# Patient Record
Sex: Female | Born: 2000
Health system: Southern US, Community
[De-identification: ages and names within clinical notes are randomized; demographics above are authoritative.]

## PROBLEM LIST (undated history)

## (undated) DIAGNOSIS — K589 Irritable bowel syndrome without diarrhea: Secondary | ICD-10-CM

## (undated) DIAGNOSIS — K219 Gastro-esophageal reflux disease without esophagitis: Secondary | ICD-10-CM

## (undated) DIAGNOSIS — F419 Anxiety disorder, unspecified: Secondary | ICD-10-CM

## (undated) DIAGNOSIS — F32A Depression, unspecified: Secondary | ICD-10-CM

## (undated) DIAGNOSIS — F329 Major depressive disorder, single episode, unspecified: Secondary | ICD-10-CM

---
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
## 2018-02-27 DIAGNOSIS — J301 Allergic rhinitis due to pollen: Secondary | ICD-10-CM | POA: Diagnosis not present

## 2018-02-27 DIAGNOSIS — J3089 Other allergic rhinitis: Secondary | ICD-10-CM | POA: Diagnosis not present

## 2018-02-27 DIAGNOSIS — J3081 Allergic rhinitis due to animal (cat) (dog) hair and dander: Secondary | ICD-10-CM | POA: Diagnosis not present

## 2018-04-11 DIAGNOSIS — J301 Allergic rhinitis due to pollen: Secondary | ICD-10-CM | POA: Diagnosis not present

## 2018-04-11 DIAGNOSIS — J3089 Other allergic rhinitis: Secondary | ICD-10-CM | POA: Diagnosis not present

## 2018-04-11 DIAGNOSIS — J3081 Allergic rhinitis due to animal (cat) (dog) hair and dander: Secondary | ICD-10-CM | POA: Diagnosis not present

## 2018-04-17 DIAGNOSIS — N946 Dysmenorrhea, unspecified: Secondary | ICD-10-CM | POA: Diagnosis not present

## 2018-04-17 DIAGNOSIS — L709 Acne, unspecified: Secondary | ICD-10-CM | POA: Diagnosis not present

## 2018-04-25 DIAGNOSIS — L709 Acne, unspecified: Secondary | ICD-10-CM | POA: Diagnosis not present

## 2018-04-25 DIAGNOSIS — M542 Cervicalgia: Secondary | ICD-10-CM | POA: Diagnosis not present

## 2018-04-25 DIAGNOSIS — N946 Dysmenorrhea, unspecified: Secondary | ICD-10-CM | POA: Diagnosis not present

## 2018-04-25 DIAGNOSIS — N939 Abnormal uterine and vaginal bleeding, unspecified: Secondary | ICD-10-CM | POA: Diagnosis not present

## 2018-04-26 DIAGNOSIS — G510 Bell's palsy: Secondary | ICD-10-CM | POA: Diagnosis not present

## 2018-05-09 DIAGNOSIS — K219 Gastro-esophageal reflux disease without esophagitis: Secondary | ICD-10-CM | POA: Diagnosis not present

## 2018-05-09 DIAGNOSIS — K8050 Calculus of bile duct without cholangitis or cholecystitis without obstruction: Secondary | ICD-10-CM | POA: Diagnosis not present

## 2018-05-09 DIAGNOSIS — R197 Diarrhea, unspecified: Secondary | ICD-10-CM | POA: Diagnosis not present

## 2018-05-16 DIAGNOSIS — J3089 Other allergic rhinitis: Secondary | ICD-10-CM | POA: Diagnosis not present

## 2018-05-16 DIAGNOSIS — J301 Allergic rhinitis due to pollen: Secondary | ICD-10-CM | POA: Diagnosis not present

## 2018-05-16 DIAGNOSIS — J3081 Allergic rhinitis due to animal (cat) (dog) hair and dander: Secondary | ICD-10-CM | POA: Diagnosis not present

## 2018-05-23 DIAGNOSIS — J3089 Other allergic rhinitis: Secondary | ICD-10-CM | POA: Diagnosis not present

## 2018-05-23 DIAGNOSIS — J3081 Allergic rhinitis due to animal (cat) (dog) hair and dander: Secondary | ICD-10-CM | POA: Diagnosis not present

## 2018-05-23 DIAGNOSIS — J301 Allergic rhinitis due to pollen: Secondary | ICD-10-CM | POA: Diagnosis not present

## 2018-06-06 DIAGNOSIS — J301 Allergic rhinitis due to pollen: Secondary | ICD-10-CM | POA: Diagnosis not present

## 2018-06-06 DIAGNOSIS — J3089 Other allergic rhinitis: Secondary | ICD-10-CM | POA: Diagnosis not present

## 2018-06-06 DIAGNOSIS — J3081 Allergic rhinitis due to animal (cat) (dog) hair and dander: Secondary | ICD-10-CM | POA: Diagnosis not present

## 2019-05-07 ENCOUNTER — Other Ambulatory Visit: Payer: Self-pay

## 2019-05-07 ENCOUNTER — Ambulatory Visit (INDEPENDENT_AMBULATORY_CARE_PROVIDER_SITE_OTHER): Payer: 59 | Admitting: Podiatry

## 2019-05-07 VITALS — Temp 98.2°F

## 2019-05-07 DIAGNOSIS — L600 Ingrowing nail: Secondary | ICD-10-CM | POA: Diagnosis not present

## 2019-05-07 MED ORDER — GENTAMICIN SULFATE 0.1 % EX CREA: 1.0000 "application " | TOPICAL_CREAM | Freq: Two times a day (BID) | CUTANEOUS | 1 refills | Status: AC

## 2019-05-07 NOTE — Patient Instructions (Signed)

## 2019-05-12 NOTE — Progress Notes (Signed)
   Subjective: Patient presents today for evaluation of pain to the lateral border of the right great toe that began about 5 weeks ago. She reports associated redness and swelling of the toe. Patient is concerned for possible ingrown nail. She went to an urgent care on 04/18/2019 and had a temporary partial nail avulsion. She now reports purulent drainage from the toe. Applying pressure and wearing shoes increases the pain. She has been applying Neosporin, alcohol and peroxide and soaking the toe in Epsom salt for treatment. Patient presents today for further treatment and evaluation.  Past Medical History:  Diagnosis Date  . Anxiety   . Depression   . GERD (gastroesophageal reflux disease)   . Irritable bowel syndrome     Objective:  General: Well developed, nourished, in no acute distress, alert and oriented x3   Dermatology: Skin is warm, dry and supple bilateral. Lateral border of the right great toe appears to be erythematous with evidence of an ingrowing nail. Pain on palpation noted to the border of the nail fold. The remaining nails appear unremarkable at this time. There are no open sores, lesions.  Vascular: Dorsalis Pedis artery and Posterior Tibial artery pedal pulses palpable. No lower extremity edema noted.   Neruologic: Grossly intact via light touch bilateral.  Musculoskeletal: Muscular strength within normal limits in all groups bilateral. Normal range of motion noted to all pedal and ankle joints.   Assesement: #1 Paronychia with ingrowing nail lateral border right hallux  #2 Pain in toe #3 Incurvated nail  Plan of Care:  1. Patient evaluated.  2. Discussed treatment alternatives and plan of care. Explained nail avulsion procedure and post procedure course to patient. 3. Patient opted for permanent partial nail avulsion of the lateral border right hallux.  4. Prior to procedure, local anesthesia infiltration utilized using 3 ml of a 50:50 mixture of 2% plain lidocaine  and 0.5% plain marcaine in a normal hallux block fashion and a betadine prep performed.  5. Partial permanent nail avulsion with chemical matrixectomy performed using 3x30sec applications of phenol followed by alcohol flush.  6. Light dressing applied. 7. Prescription for Gentamicin cream provided to patient to use daily with a bandage.  8. Return to clinic in 2 weeks.  Felecia Shelling, DPM [facility]  Dr. Felecia Shelling, DPM    [address]                                        [place], [place] [address]                Office [phone]  Fax [phone]

## 2019-05-21 ENCOUNTER — Other Ambulatory Visit: Payer: Self-pay

## 2019-05-21 ENCOUNTER — Ambulatory Visit (INDEPENDENT_AMBULATORY_CARE_PROVIDER_SITE_OTHER): Payer: 59 | Admitting: Podiatry

## 2019-05-21 DIAGNOSIS — L600 Ingrowing nail: Secondary | ICD-10-CM | POA: Diagnosis not present

## 2019-05-21 DIAGNOSIS — L03031 Cellulitis of right toe: Secondary | ICD-10-CM | POA: Diagnosis not present

## 2019-05-21 MED ORDER — DOXYCYCLINE HYCLATE 100 MG PO TABS: 100.0000 mg | ORAL_TABLET | Freq: Two times a day (BID) | ORAL | 0 refills | Status: AC

## 2019-05-27 NOTE — Progress Notes (Signed)
   Subjective: 19 y.o. female presents today status post permanent nail avulsion procedure of the lateral border of the right great toe that was performed on 05/07/2019. She reports tenderness of the area that is exacerbated by touching and applying pressure to the toe. She reports associated redness and drainage. She has been using Gentamicin cream as directed. Patient is here for further evaluation and treatment.    Past Medical History:  Diagnosis Date  . Anxiety   . Depression   . GERD (gastroesophageal reflux disease)   . Irritable bowel syndrome     Objective: Skin is warm, dry and supple. Nail and respective nail fold appears to be healing appropriately. Open wound to the associated nail fold with a granular wound base and moderate amount of fibrotic tissue. Minimal drainage noted. Mild erythema around the periungual region likely due to phenol chemical matricectomy.  Assessment: #1 postop permanent partial nail avulsion lateral border right hallux #2 open wound periungual nail fold of respective digit.   Plan of care: #1 patient was evaluated  #2 debridement of open wound was performed to the periungual border of the respective toe using a currette. Antibiotic ointment and Band-Aid was applied. #3 Prescription for Doxycycline 100 mg provided to patient.  #4 Continue using Gentamicin cream.  #5 patient is to return to clinic in 2 weeks.   Felecia Shelling, DPM [facility]  Dr. Felecia Shelling, DPM    [address]                                        [place], [place] [address]                Office [phone]  Fax [phone]

## 2019-06-04 ENCOUNTER — Other Ambulatory Visit: Payer: Self-pay

## 2019-06-04 ENCOUNTER — Ambulatory Visit: Payer: 59 | Admitting: Podiatry

## 2019-06-04 ENCOUNTER — Encounter: Payer: Self-pay | Admitting: Podiatry

## 2019-06-04 ENCOUNTER — Ambulatory Visit (INDEPENDENT_AMBULATORY_CARE_PROVIDER_SITE_OTHER): Payer: 59 | Admitting: Podiatry

## 2019-06-04 DIAGNOSIS — L03031 Cellulitis of right toe: Secondary | ICD-10-CM | POA: Diagnosis not present

## 2019-06-04 DIAGNOSIS — L600 Ingrowing nail: Secondary | ICD-10-CM | POA: Diagnosis not present

## 2019-06-07 NOTE — Progress Notes (Signed)
   Subjective: 19 y.o. female presents today status post permanent nail avulsion procedure of the lateral border of the right great toe that was performed on 05/07/2019. She states she is doing well and denies any pain. She reports some mild tenderness caused by touch. She has completed the course of Doxycycline and continues to apply Gentamicin cream as directed. Patient is here for further evaluation and treatment.    Past Medical History:  Diagnosis Date  . Anxiety   . Depression   . GERD (gastroesophageal reflux disease)   . Irritable bowel syndrome     Objective: Skin is warm, dry and supple. Nail and respective nail fold appears to be healing appropriately. Open wound to the associated nail fold with a granular wound base and moderate amount of fibrotic tissue. Minimal drainage noted. Mild erythema around the periungual region likely due to phenol chemical matricectomy.  Assessment: #1 postop permanent partial nail avulsion lateral border right hallux #2 open wound periungual nail fold of respective digit.   Plan of care: #1 patient was evaluated  #2 Light debridement of open wound was performed to periungual border of the respective toe performed using a curette.  #3 May resume full activity with no restrictions.  #4 Recommended good shoe gear.  #5 Return to clinic as needed.    Felecia Shelling, DPM [facility]  Dr. Felecia Shelling, DPM    [address]                                        [place], [place] [address]                Office [phone]  Fax [phone]

## 2019-06-29 IMAGING — US US ABDOMEN LIMITED
1 series · 14 of 25 positions shown · non-contrast
Comparison: None.

CLINICAL DATA: Epigastric pain with nausea and diarrhea for 2 days.

EXAM:
ULTRASOUND ABDOMEN LIMITED RIGHT UPPER QUADRANT

[Series 1: us abdomen limited · 0.15mm/px · 14 of 36 slices shown]
[im 1/36]
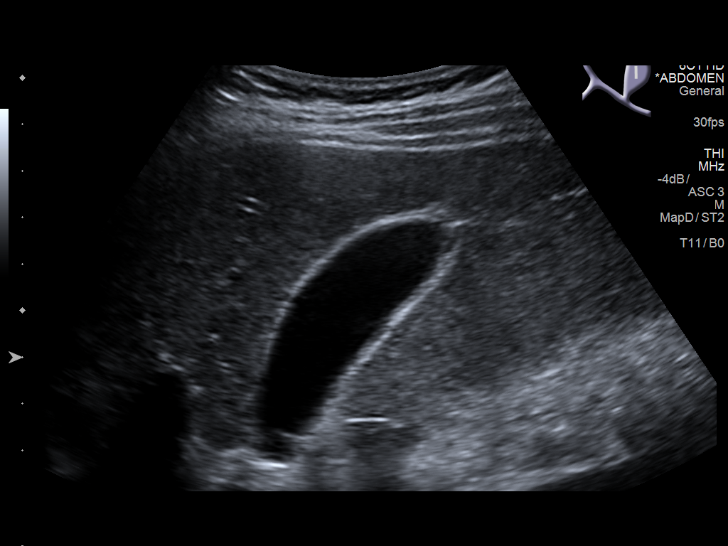
[im 3/36]
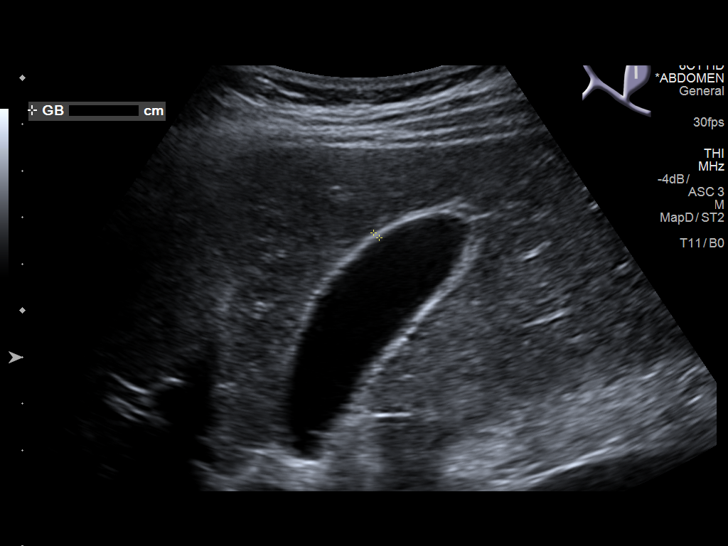
[im 6/36]
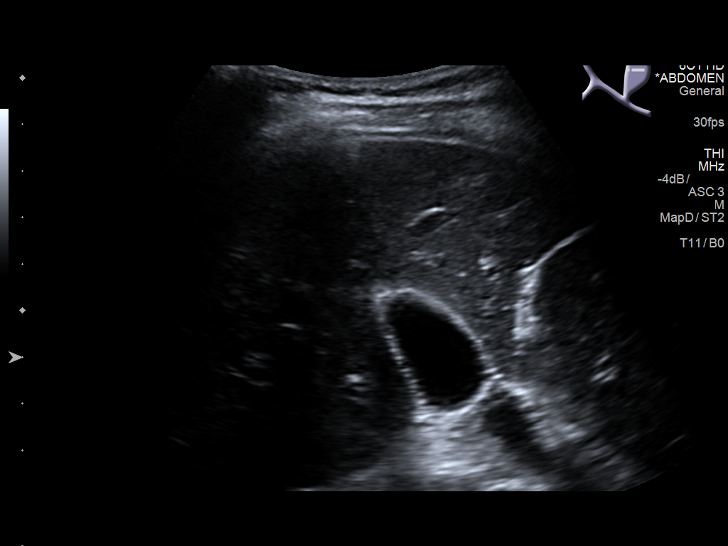
[im 9/36]
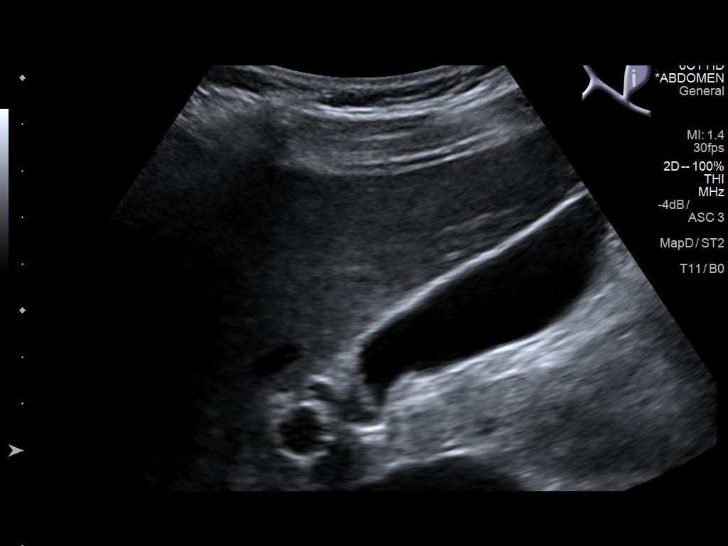
[im 12/36]
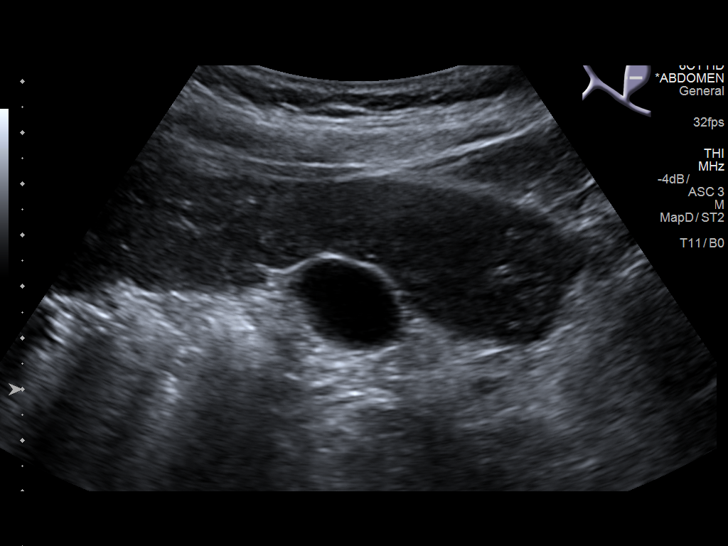
[im 14/36]
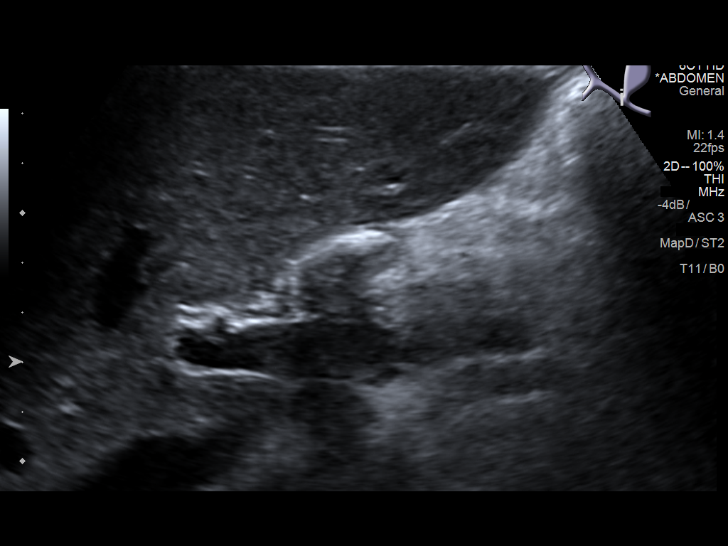
[im 17/36]
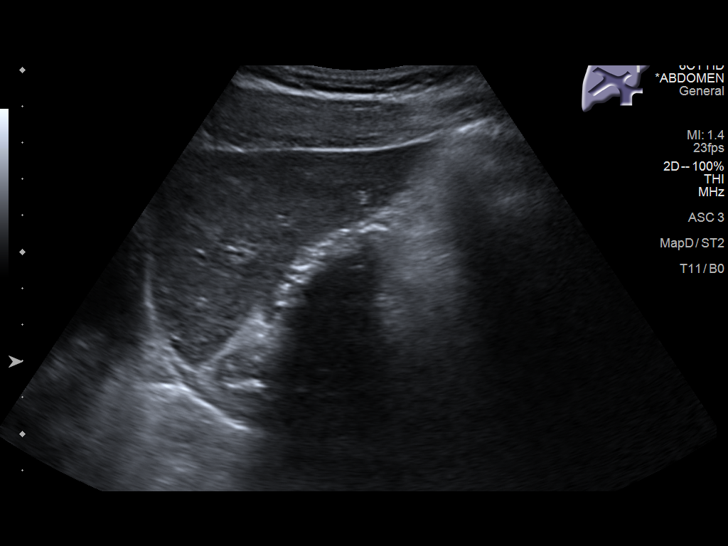
[im 19/36]
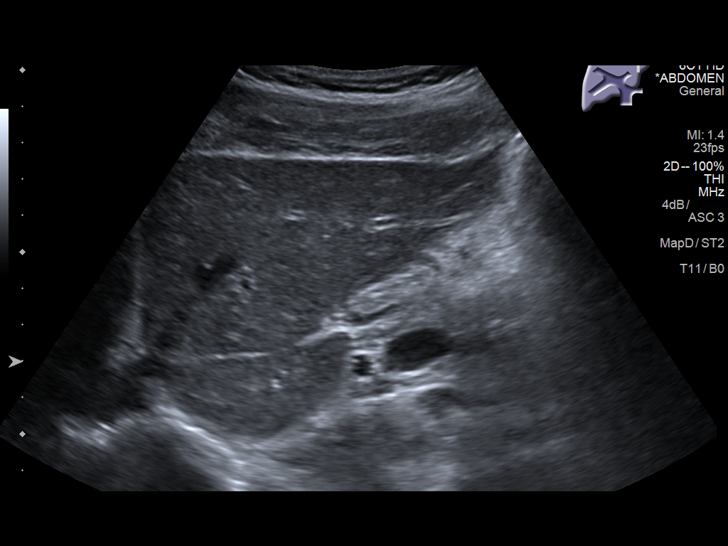
[im 22/36]
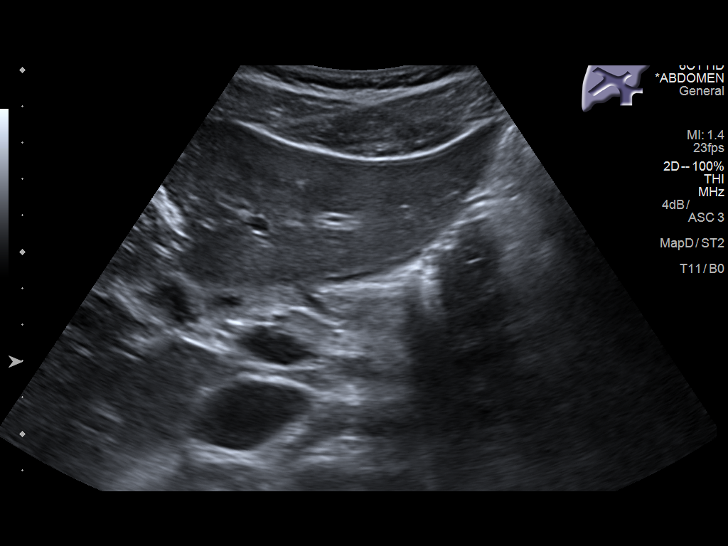
[im 24/36]
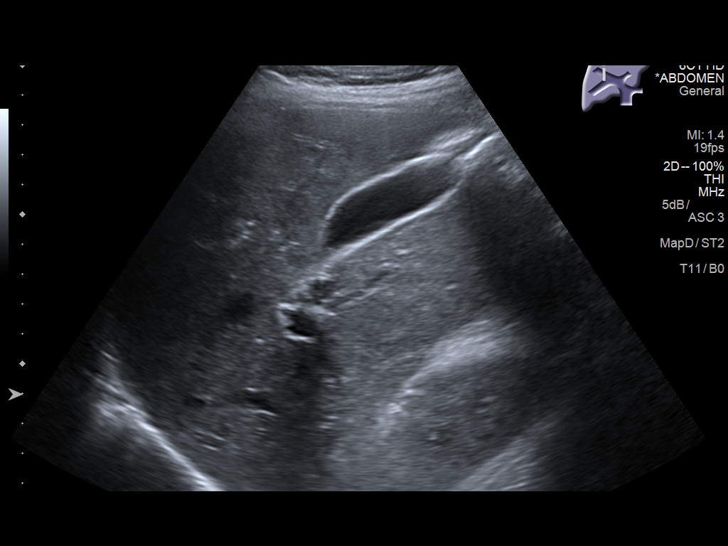
[im 27/36]
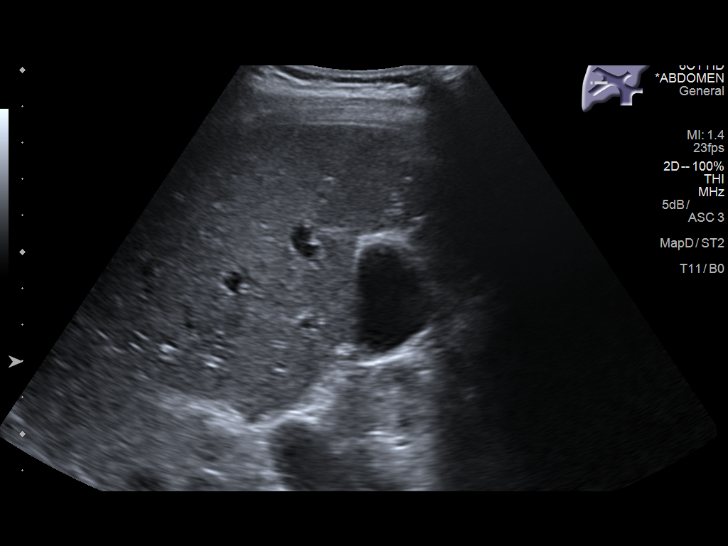
[im 30/36]
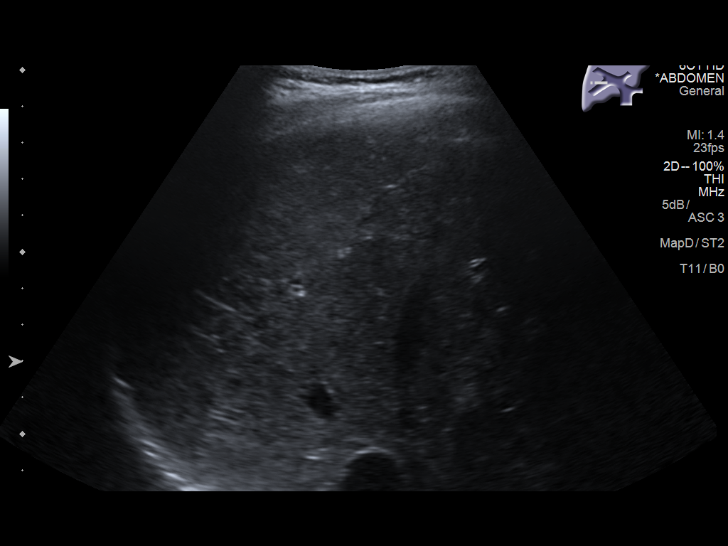
[im 33/36]
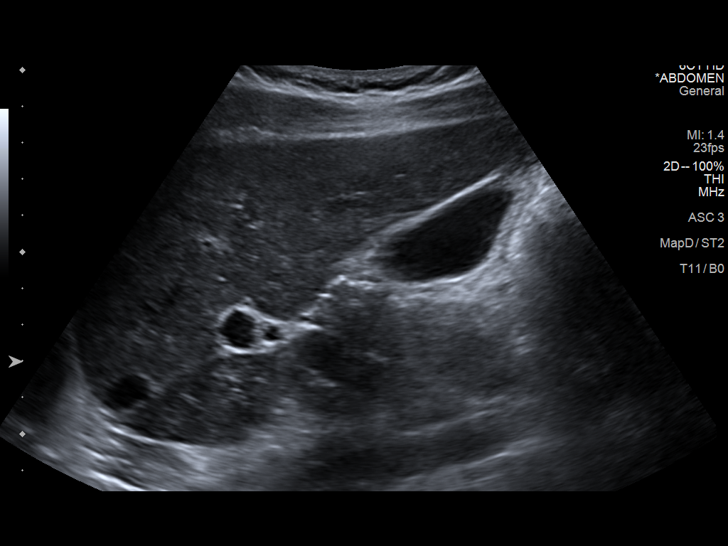
[im 36/36]
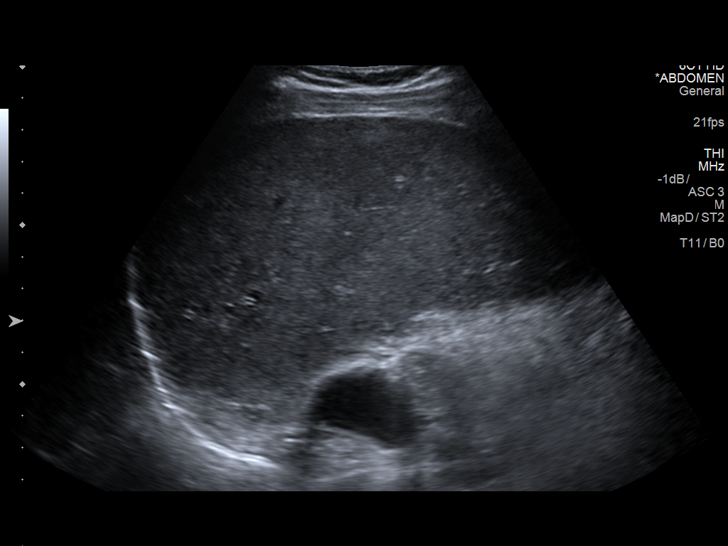

[14 of 25 positions shown; findings below may reference images not displayed]

FINDINGS: Gallbladder:

No gallstones or wall thickening visualized. No sonographic Murphy
sign noted by sonographer.

Common bile duct:

Diameter: 2 mm

Liver:

No focal lesion identified. Within normal limits in parenchymal
echogenicity. Portal vein is patent on color Doppler imaging with
normal direction of blood flow towards the liver.
IMPRESSION: Normal right upper quadrant ultrasound.
